# Patient Record
Sex: Male | Born: 1960 | Race: White | Hispanic: No | Marital: Married | Smoking: Current every day smoker
Health system: Southern US, Community
[De-identification: ages and names within clinical notes are randomized; demographics above are authoritative.]

## PROBLEM LIST (undated history)

## (undated) DIAGNOSIS — I10 Essential (primary) hypertension: Secondary | ICD-10-CM

## (undated) DIAGNOSIS — R809 Proteinuria, unspecified: Secondary | ICD-10-CM

## (undated) DIAGNOSIS — M79609 Pain in unspecified limb: Secondary | ICD-10-CM

## (undated) DIAGNOSIS — N189 Chronic kidney disease, unspecified: Secondary | ICD-10-CM

## (undated) DIAGNOSIS — I771 Stricture of artery: Secondary | ICD-10-CM

## (undated) DIAGNOSIS — K859 Acute pancreatitis without necrosis or infection, unspecified: Secondary | ICD-10-CM

## (undated) DIAGNOSIS — N400 Enlarged prostate without lower urinary tract symptoms: Secondary | ICD-10-CM

## (undated) HISTORY — DX: Essential (primary) hypertension: I10

## (undated) HISTORY — DX: Stricture of artery: I77.1

## (undated) HISTORY — DX: Chronic kidney disease, unspecified: N18.9

## (undated) HISTORY — DX: Pain in unspecified limb: M79.609

## (undated) HISTORY — DX: Acute pancreatitis without necrosis or infection, unspecified: K85.90

## (undated) HISTORY — DX: Proteinuria, unspecified: R80.9

## (undated) HISTORY — DX: Benign prostatic hyperplasia without lower urinary tract symptoms: N40.0

---

## 1988-03-15 HISTORY — PX: HERNIA REPAIR: SHX51

## 2008-08-07 ENCOUNTER — Ambulatory Visit (HOSPITAL_COMMUNITY): Admission: RE | Admit: 2008-08-07 | Discharge: 2008-08-07 | Payer: Self-pay | Admitting: Nephrology

## 2008-08-07 ENCOUNTER — Encounter (INDEPENDENT_AMBULATORY_CARE_PROVIDER_SITE_OTHER): Payer: Self-pay | Admitting: Interventional Radiology

## 2008-09-03 ENCOUNTER — Encounter: Admission: RE | Admit: 2008-09-03 | Discharge: 2008-09-03 | Payer: Self-pay | Admitting: Nephrology

## 2010-06-23 LAB — PROTIME-INR: INR: 1 (ref 0.00–1.49)

## 2010-06-23 LAB — CBC
Hemoglobin: 15.2 g/dL (ref 13.0–17.0)
RBC: 4.68 MIL/uL (ref 4.22–5.81)
RDW: 12.9 % (ref 11.5–15.5)

## 2010-06-23 LAB — APTT: aPTT: 47 seconds — ABNORMAL HIGH (ref 24–37)

## 2010-08-05 IMAGING — US US BIOPSY
1 series · 13 of 15 positions shown · non-contrast
Comparison: none

CLINICAL DATA: Proteinuria.  The patient requires random renal
biopsy.

[Series 1: us biopsy · 0.30mm/px · 13 of 15 slices shown]
[im 1/15]
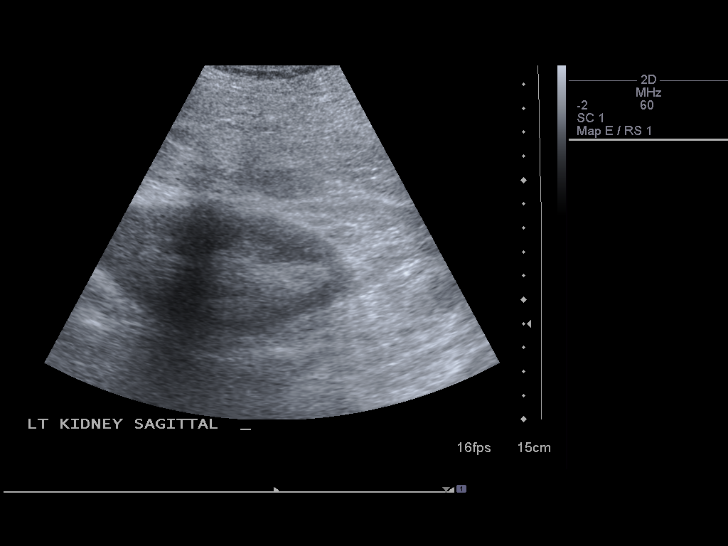
[im 2/15]
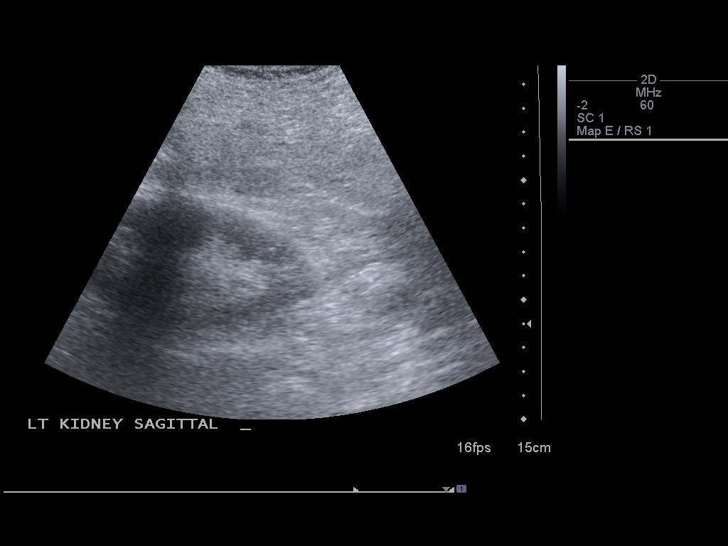
[im 3/15]
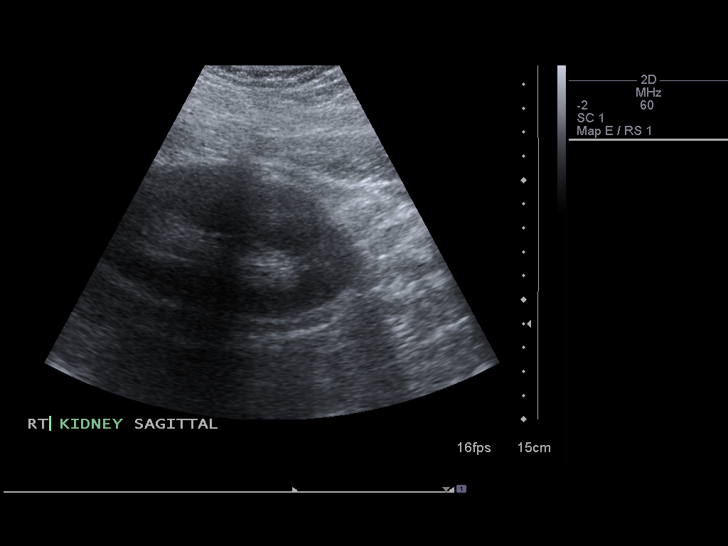
[im 5/15]
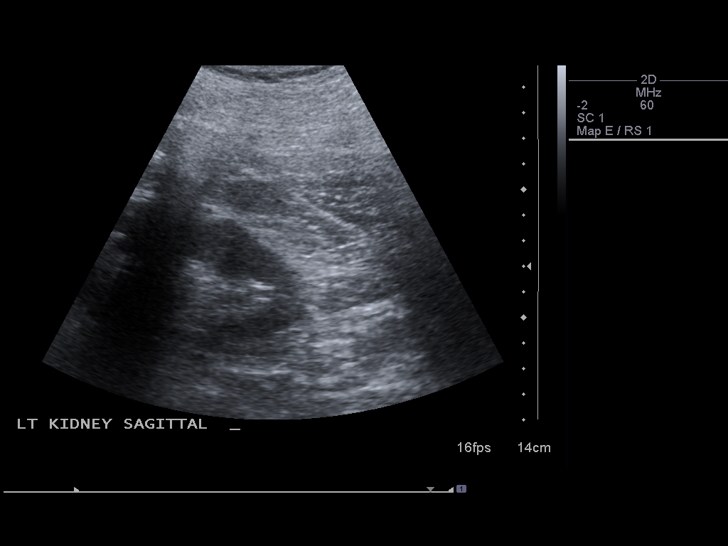
[im 6/15]
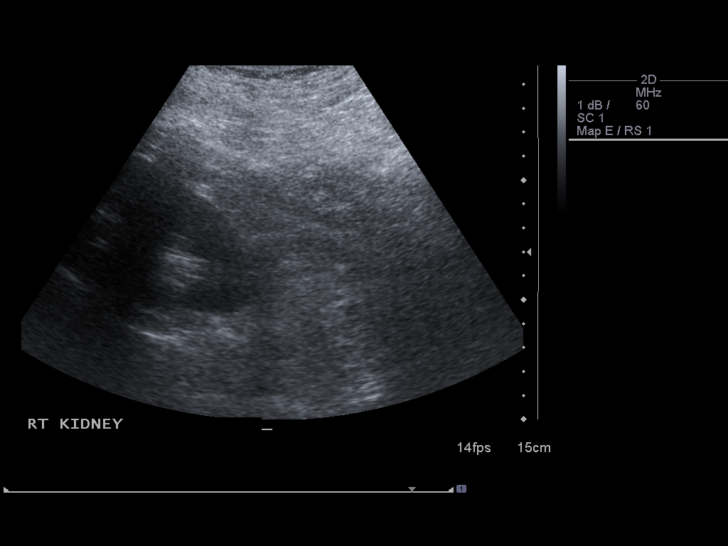
[im 7/15]
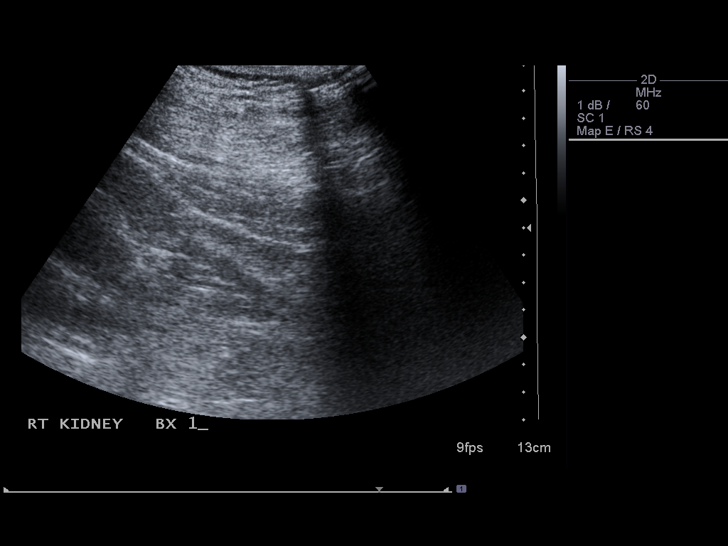
[im 8/15]
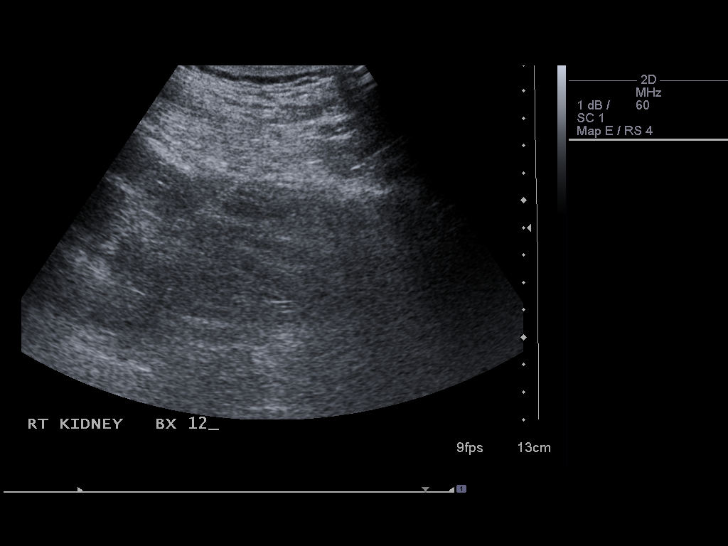
[im 9/15]
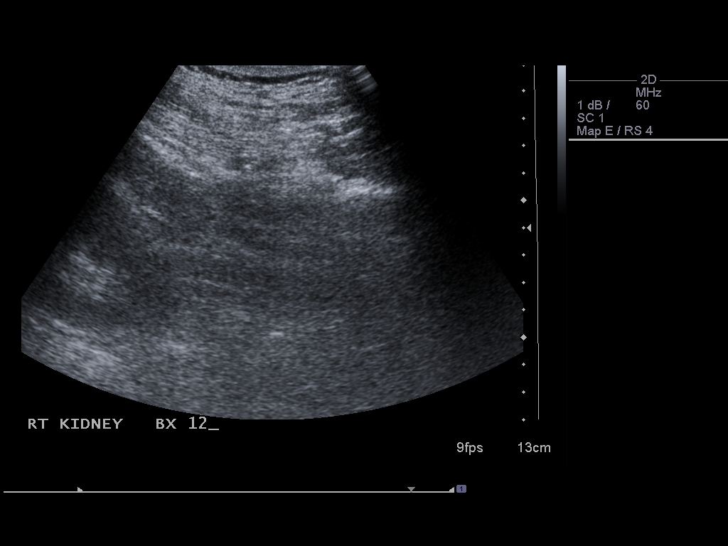
[im 10/15]
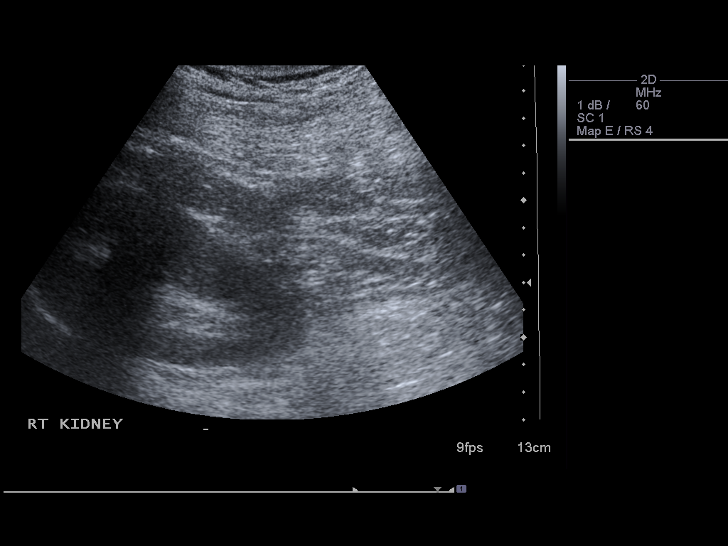
[im 11/15]
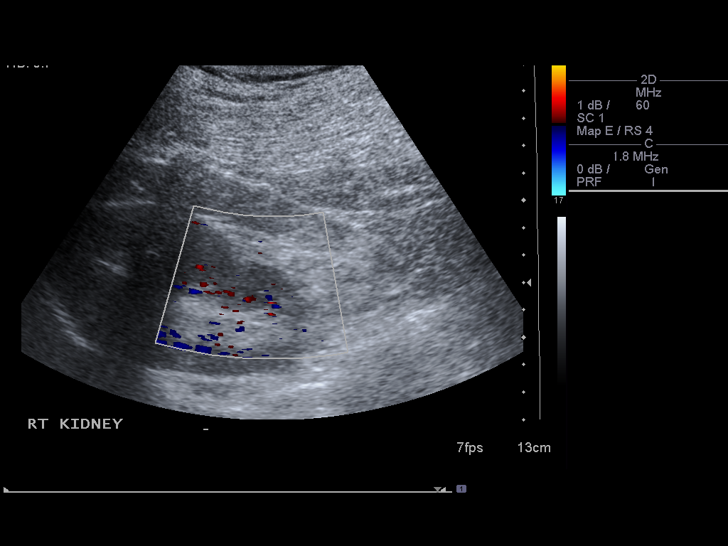
[im 13/15]
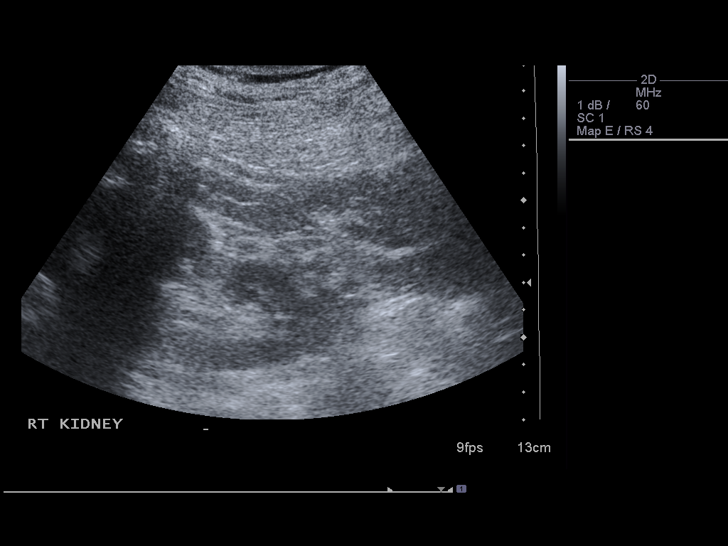
[im 14/15]
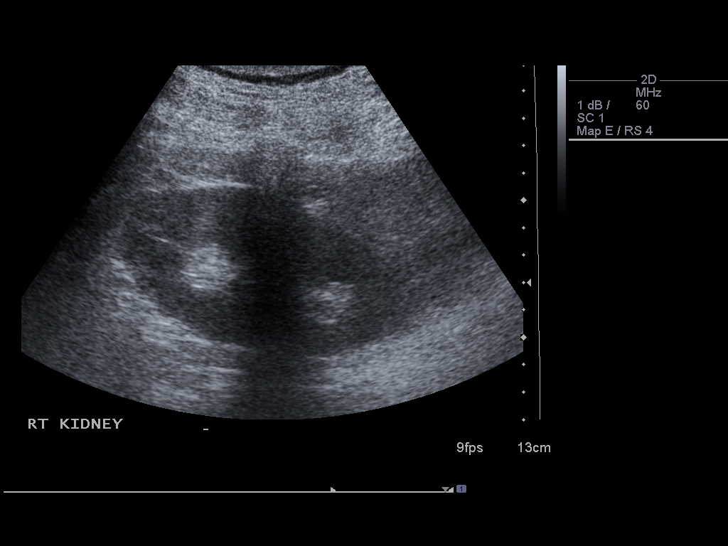
[im 15/15]
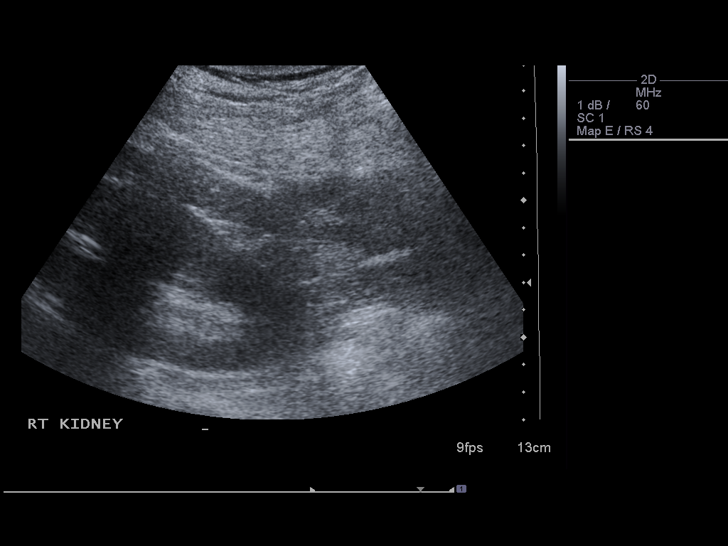

[13 of 15 positions shown; findings below may reference images not displayed]

ULTRASOUND GUIDED CORE BIOPSY OF THE RIGHT KIDNEY

Sedation: Versed 1.5 mg IV, Fentanyl 75 mcg IV

Total Moderate Sedation Time: 20 minutes.

Procedure:  The procedure, risks, benefits, and alternatives were
explained to the patient.  Questions regarding the procedure were
encouraged and answered.  The patient understands and consents to
the procedure.

The right flank region was prepped with betadine in a sterile
fashion, and a sterile drape was applied covering the operative
field.  A sterile gown and sterile gloves were used for the
procedure. Local anesthesia was provided with 1% Lidocaine.

Under ultrasound guidance, a 16 gauge core biopsy device was
advanced to the lower pole of the right kidney.  Three passes were
made with the core biopsy device.  Samples were placed in saline
and submitted for pathologic analysis.

Complications: None
FINDINGS: The right kidney was chosen due to better visibility and
thickness of the lower pole cortex.  The first pass resulted in an
intact solid core.  The second core biopsy pass was fragmented.
The third core biopsy pass resulted in an intact core.
IMPRESSION: Ultrasound guided core biopsy of the right kidney.  Three separate
passes were made of the lower pole with a 16 gauge core biopsy
device.

## 2012-03-21 ENCOUNTER — Encounter: Payer: Self-pay | Admitting: Vascular Surgery

## 2012-03-22 ENCOUNTER — Ambulatory Visit (INDEPENDENT_AMBULATORY_CARE_PROVIDER_SITE_OTHER): Payer: Self-pay | Admitting: Vascular Surgery

## 2012-03-22 ENCOUNTER — Encounter: Payer: Self-pay | Admitting: Vascular Surgery

## 2012-03-22 VITALS — BP 125/89 | HR 95 | Ht 70.0 in | Wt 269.6 lb

## 2012-03-22 DIAGNOSIS — I7090 Unspecified atherosclerosis: Secondary | ICD-10-CM

## 2012-03-22 DIAGNOSIS — I7409 Other arterial embolism and thrombosis of abdominal aorta: Secondary | ICD-10-CM

## 2012-03-22 DIAGNOSIS — I749 Embolism and thrombosis of unspecified artery: Secondary | ICD-10-CM
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
## 2012-03-22 NOTE — Assessment & Plan Note (Signed)
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 He has significant claudication and early rest pain. He is not a candidate for endovascular approach to his disease. For this reason I've explained that there are 2 options. I would favor conservative treatment which would include complete cessation of tobacco and getting on a structured walking program. We have had a long discussion about the importance of both of these. The second option would be surgical intervention. The options would be aortofemoral bypass grafting versus right axillofemoral bypass grafting. He would be at significant risk for aortofemoral bypass grafting given his obesity and the juxtarenal occlusion. We have discussed the potential risks of this surgery including the risk of wound healing problems related to his obesity and aortic graft infection. He does have some pressure difference in his arms and if he required axillofemoral bypass grafting it looks like he would require a right axillobifemoral bypass graft. He is in agreement with attempting conservative treatment. I will see him back in 3 months with ABIs. He knows to call sooner if he has problems.

## 2012-03-31 ENCOUNTER — Encounter: Payer: Self-pay | Admitting: Vascular Surgery

## 2012-06-20 ENCOUNTER — Encounter: Payer: Self-pay | Admitting: Vascular Surgery

## 2012-06-21 ENCOUNTER — Encounter: Payer: Self-pay | Admitting: Vascular Surgery

## 2012-06-21 ENCOUNTER — Ambulatory Visit (INDEPENDENT_AMBULATORY_CARE_PROVIDER_SITE_OTHER): Payer: No Typology Code available for payment source | Admitting: Vascular Surgery

## 2012-06-21 ENCOUNTER — Ambulatory Visit: Payer: Self-pay | Admitting: Vascular Surgery

## 2012-06-21 ENCOUNTER — Encounter (INDEPENDENT_AMBULATORY_CARE_PROVIDER_SITE_OTHER): Payer: No Typology Code available for payment source | Admitting: *Deleted

## 2012-06-21 VITALS — BP 121/84 | HR 90 | Ht 70.0 in | Wt 274.0 lb

## 2012-06-21 DIAGNOSIS — I749 Embolism and thrombosis of unspecified artery: Secondary | ICD-10-CM

## 2012-06-21 DIAGNOSIS — I7409 Other arterial embolism and thrombosis of abdominal aorta: Secondary | ICD-10-CM

## 2012-06-21 DIAGNOSIS — I700 Atherosclerosis of aorta: Secondary | ICD-10-CM

## 2012-06-21 DIAGNOSIS — I7090 Unspecified atherosclerosis: Secondary | ICD-10-CM

## 2012-06-21 NOTE — Progress Notes (Addendum)
[facility] [place]  Established Intermittent Claudication  History of Present Illness  Keith Townsend is a 52 y.o. (Aug 06, 1960) male who presents with chief complaint: foot and ankle pain with prolonged standing and walking.  The patient's symptoms have not progressed.  The patient's symptoms are: claudication.   The patient's treatment regimen currently included: maximal medical management and smoking cessation, walking program with improved diet.  He was smoking 3 packs a day and is now down to 1.5 per day.  Past Medical History, Past Surgical History, Social History, Family History, Medications, Allergies, and Review of Systems are unchanged from previous evaluation on 03-22-2012.  Physical Examination  Filed Vitals:   06/21/12 1336  BP: 121/84  Pulse: 90  Height: 5\' 10"  (1.778 m)  Weight: 274 lb (124.286 kg)  SpO2: 97%   Body mass index is 39.32 kg/(m^2).  General: A&O x 3, WDWN obese  Pulmonary: Sym exp, good air movt,+ wheezing  Cardiac: RRR, Nl S1, S2, no Murmurs, rubs or gallops  Vascular: Skin is warm to touch without ischemic changes bilateral lower extremities.   Vessel Right Left  Radial Palpable Palpable              Aorta Not palpable N/A  Femoral Not Palpable Not Palpable  Popliteal Not palpable Not palpable  PT not Palpable not Palpable  DP not Palpable not Palpable   Musculoskeletal: M/S 5/5 throughout   Neurologic: Pain and light touch intact in extremities   Non-Invasive Vascular Imaging ABI (Date: 06-21-2012)  RLE: 0.49  LLE: 0.58   Medical Decision Making  Keith Townsend is a 52 y.o. male who presents with: bilateral leg intermittent claudication without evidence of critical limb ischemia.  Based on the patient's vascular studies and examination, I have offered the patient: medical management.  Stop smoking walking program for exercise and healthy diet plan.  He will continue his current medications to  include Asprin daily.  Dr. Edilia Bo discussed in depth with the patient the nature of atherosclerosis, and emphasized the importance of maximal medical management including strict control of blood pressure, blood glucose, and lipid levels, antiplatelet agents, obtaining regular exercise, and cessation of smoking.  The patient is aware that without maximal medical management the underlying atherosclerotic disease process will progress, limiting the benefit of any interventions.  His surgery option is AAA to femoral by-pass which we will try to avoid if possible.  Thank you for allowing [place] to participate in this patient's care.  He will f/u in 6 months for ABI'S.  [name] [facility] PA-C [facility] [place] Field Office: [phone]   06/21/2012, 2:14 PM  Agree with above. We have again had a long discussion about conservative treatment versus aortofemoral bypass grafting. Given his obesity he would be at significantly increased risk for aortofemoral bypass grafting. I have discussed with the patient the nature of atherosclerosis, and emphasized the importance of maximal medical management including control of blood pressure, blood glucose, and cholesterol levels, antiplatelet agents, obtaining regular exercise, and cessation of smoking. The patient is aware that without maximal medical management the underlying atherosclerotic disease process will progress and also limit the benefit of any interventions. I'll see him back in 6 months to follow up ABIs. He knows to call sooner if he has problems.  Waverly Ferrari, MD, FACS Beeper [phone] 06/21/2012

## 2012-06-22 NOTE — Addendum Note (Signed)
Addended by: Adria Dill L on: 06/22/2012 09:42 AM   Modules accepted: Orders

## 2012-12-27 ENCOUNTER — Ambulatory Visit: Payer: No Typology Code available for payment source | Admitting: Family

## 2012-12-27 ENCOUNTER — Inpatient Hospital Stay (HOSPITAL_COMMUNITY): Admission: RE | Admit: 2012-12-27 | Payer: No Typology Code available for payment source | Source: Ambulatory Visit

## 2013-01-02 ENCOUNTER — Encounter: Payer: Self-pay | Admitting: Family

## 2013-01-03 ENCOUNTER — Ambulatory Visit (INDEPENDENT_AMBULATORY_CARE_PROVIDER_SITE_OTHER): Payer: No Typology Code available for payment source | Admitting: Family

## 2013-01-03 ENCOUNTER — Encounter: Payer: Self-pay | Admitting: Family

## 2013-01-03 ENCOUNTER — Ambulatory Visit (HOSPITAL_COMMUNITY)
Admission: RE | Admit: 2013-01-03 | Discharge: 2013-01-03 | Disposition: A | Discharge status: Home / Self Care | Payer: No Typology Code available for payment source | Source: Ambulatory Visit | Attending: Vascular Surgery | Admitting: Vascular Surgery

## 2013-01-03 VITALS — BP 124/81 | HR 81 | Resp 16 | Ht 70.0 in | Wt 291.0 lb

## 2013-01-03 DIAGNOSIS — I7090 Unspecified atherosclerosis: Secondary | ICD-10-CM

## 2013-01-03 DIAGNOSIS — I749 Embolism and thrombosis of unspecified artery: Secondary | ICD-10-CM

## 2013-01-03 DIAGNOSIS — I7409 Other arterial embolism and thrombosis of abdominal aorta: Secondary | ICD-10-CM

## 2013-01-03 DIAGNOSIS — I739 Peripheral vascular disease, unspecified: Secondary | ICD-10-CM
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
## 2013-01-03 NOTE — Patient Instructions (Signed)
Peripheral Vascular Disease Peripheral Vascular Disease (PVD), also called Peripheral Arterial Disease (PAD), is a circulation problem caused by cholesterol (atherosclerotic plaque) deposits in the arteries. PVD commonly occurs in the lower extremities (legs) but it can occur in other areas of the body, such as your arms. The cholesterol buildup in the arteries reduces blood flow which can cause pain and other serious problems. The presence of PVD can place a person at risk for Coronary Artery Disease (CAD).  CAUSES  Causes of PVD can be many. It is usually associated with more than one risk factor such as:   High Cholesterol.  Smoking.  Diabetes.  Lack of exercise or inactivity.  High blood pressure (hypertension).  Obesity.  Family history. SYMPTOMS   When the lower extremities are affected, patients with PVD may experience:  Leg pain with exertion or physical activity. This is called INTERMITTENT CLAUDICATION. This may present as cramping or numbness with physical activity. The location of the pain is associated with the level of blockage. For example, blockage at the abdominal level (distal abdominal aorta) may result in buttock or hip pain. Lower leg arterial blockage may result in calf pain.  As PVD becomes more severe, pain can develop with less physical activity.  In people with severe PVD, leg pain may occur at rest.  Other PVD signs and symptoms:  Leg numbness or weakness.  Coldness in the affected leg or foot, especially when compared to the other leg.  A change in leg color.  Patients with significant PVD are more prone to ulcers or sores on toes, feet or legs. These may take longer to heal or may reoccur. The ulcers or sores can become infected.  If signs and symptoms of PVD are ignored, gangrene may occur. This can result in the loss of toes or loss of an entire limb.  Not all leg pain is related to PVD. Other medical conditions can cause leg pain such  as:  Blood clots (embolism) or Deep Vein Thrombosis.  Inflammation of the blood vessels (vasculitis).  Spinal stenosis. DIAGNOSIS  Diagnosis of PVD can involve several different types of tests. These can include:  Pulse Volume Recording Method (PVR). This test is simple, painless and does not involve the use of X-rays. PVR involves measuring and comparing the blood pressure in the arms and legs. An ABI (Ankle-Brachial Index) is calculated. The normal ratio of blood pressures is 1. As this number becomes smaller, it indicates more severe disease.  < 0.95  indicates significant narrowing in one or more leg vessels.  <0.8 there will usually be pain in the foot, leg or buttock with exercise.  <0.4 will usually have pain in the legs at rest.  <0.25  usually indicates limb threatening PVD.  Doppler detection of pulses in the legs. This test is painless and checks to see if you have a pulses in your legs/feet.  A dye or contrast material (a substance that highlights the blood vessels so they show up on x-ray) may be given to help your caregiver better see the arteries for the following tests. The dye is eliminated from your body by the kidney's. Your caregiver may order blood work to check your kidney function and other laboratory values before the following tests are performed:  Magnetic Resonance Angiography (MRA). An MRA is a picture study of the blood vessels and arteries. The MRA machine uses a large magnet to produce images of the blood vessels.  Computed Tomography Angiography (CTA). A CTA is a   specialized x-ray that looks at how the blood flows in your blood vessels. An IV may be inserted into your arm so contrast dye can be injected.  Angiogram. Is a procedure that uses x-rays to look at your blood vessels. This procedure is minimally invasive, meaning a small incision (cut) is made in your groin. A small tube (catheter) is then inserted into the artery of your groin. The catheter is  guided to the blood vessel or artery your caregiver wants to examine. Contrast dye is injected into the catheter. X-rays are then taken of the blood vessel or artery. After the images are obtained, the catheter is taken out. TREATMENT  Treatment of PVD involves many interventions which may include:  Lifestyle changes:  Quitting smoking.  Exercise.  Following a low fat, low cholesterol diet.  Control of diabetes.  Foot care is very important to the PVD patient. Good foot care can help prevent infection.  Medication:  Cholesterol-lowering medicine.  Blood pressure medicine.  Anti-platelet drugs.  Certain medicines may reduce symptoms of Intermittent Claudication.  Interventional/Surgical options:  Angioplasty. An Angioplasty is a procedure that inflates a balloon in the blocked artery. This opens the blocked artery to improve blood flow.  Stent Implant. A wire mesh tube (stent) is placed in the artery. The stent expands and stays in place, allowing the artery to remain open.  Peripheral Bypass Surgery. This is a surgical procedure that reroutes the blood around a blocked artery to help improve blood flow. This type of procedure may be performed if Angioplasty or stent implants are not an option. SEEK IMMEDIATE MEDICAL CARE IF:   You develop pain or numbness in your arms or legs.  Your arm or leg turns cold, becomes blue in color.  You develop redness, warmth, swelling and pain in your arms or legs. MAKE SURE YOU:   Understand these instructions.  Will watch your condition.  Will get help right away if you are not doing well or get worse. Document Released: 04/08/2004 Document Revised: 05/24/2011 Document Reviewed: 03/05/2008 Exit[employer] Patient Information 2014 [employer].   Smoking Cessation Quitting smoking is important to your health and has many advantages. However, it is not always easy to quit since nicotine is a very addictive drug. Often times, people try 3  times or more before being able to quit. This document explains the best ways for you to prepare to quit smoking. Quitting takes hard work and a lot of effort, but you can do it. ADVANTAGES OF QUITTING SMOKING  You will live longer, feel better, and live better.  Your body will feel the impact of quitting smoking almost immediately.  Within 20 minutes, blood pressure decreases. Your pulse returns to its normal level.  After 8 hours, carbon monoxide levels in the blood return to normal. Your oxygen level increases.  After 24 hours, the chance of having a heart attack starts to decrease. Your breath, hair, and body stop smelling like smoke.  After 48 hours, damaged nerve endings begin to recover. Your sense of taste and smell improve.  After 72 hours, the body is virtually free of nicotine. Your bronchial tubes relax and breathing becomes easier.  After 2 to 12 weeks, lungs can hold more air. Exercise becomes easier and circulation improves.  The risk of having a heart attack, stroke, cancer, or lung disease is greatly reduced.  After 1 year, the risk of coronary heart disease is cut in half.  After 5 years, the risk of stroke falls to   the same as a nonsmoker.  After 10 years, the risk of lung cancer is cut in half and the risk of other cancers decreases significantly.  After 15 years, the risk of coronary heart disease drops, usually to the level of a nonsmoker.  If you are pregnant, quitting smoking will improve your chances of having a healthy baby.  The people you live with, especially any children, will be healthier.  You will have extra money to spend on things other than cigarettes. QUESTIONS TO THINK ABOUT BEFORE ATTEMPTING TO QUIT You may want to talk about your answers with your caregiver.  Why do you want to quit?  If you tried to quit in the past, what helped and what did not?  What will be the most difficult situations for you after you quit? How will you plan to  handle them?  Who can help you through the tough times? Your family? Friends? A caregiver?  What pleasures do you get from smoking? What ways can you still get pleasure if you quit? Here are some questions to ask your caregiver:  How can you help me to be successful at quitting?  What medicine do you think would be best for me and how should I take it?  What should I do if I need more help?  What is smoking withdrawal like? How can I get information on withdrawal? GET READY  Set a quit date.  Change your environment by getting rid of all cigarettes, ashtrays, matches, and lighters in your home, car, or work. Do not let people smoke in your home.  Review your past attempts to quit. Think about what worked and what did not. GET SUPPORT AND ENCOURAGEMENT You have a better chance of being successful if you have help. You can get support in many ways.  Tell your family, friends, and co-workers that you are going to quit and need their support. Ask them not to smoke around you.  Get individual, group, or telephone counseling and support. Programs are available at local hospitals and health centers. Call your local health department for information about programs in your area.  Spiritual beliefs and practices may help some smokers quit.  Download a "quit meter" on your computer to keep track of quit statistics, such as how long you have gone without smoking, cigarettes not smoked, and money saved.  Get a self-help book about quitting smoking and staying off of tobacco. LEARN NEW SKILLS AND BEHAVIORS  Distract yourself from urges to smoke. Talk to someone, go for a walk, or occupy your time with a task.  Change your normal routine. Take a different route to work. Drink tea instead of coffee. Eat breakfast in a different place.  Reduce your stress. Take a hot bath, exercise, or read a book.  Plan something enjoyable to do every day. Reward yourself for not smoking.  Explore  interactive web-based programs that specialize in helping you quit. GET MEDICINE AND USE IT CORRECTLY Medicines can help you stop smoking and decrease the urge to smoke. Combining medicine with the above behavioral methods and support can greatly increase your chances of successfully quitting smoking.  Nicotine replacement therapy helps deliver nicotine to your body without the negative effects and risks of smoking. Nicotine replacement therapy includes nicotine gum, lozenges, inhalers, nasal sprays, and skin patches. Some may be available over-the-counter and others require a prescription.  Antidepressant medicine helps people abstain from smoking, but how this works is unknown. This medicine is available by prescription.    Nicotinic receptor partial agonist medicine simulates the effect of nicotine in your brain. This medicine is available by prescription. Ask your caregiver for advice about which medicines to use and how to use them based on your health history. Your caregiver will tell you what side effects to look out for if you choose to be on a medicine or therapy. Carefully read the information on the package. Do not use any other product containing nicotine while using a nicotine replacement product.  RELAPSE OR DIFFICULT SITUATIONS Most relapses occur within the first 3 months after quitting. Do not be discouraged if you start smoking again. Remember, most people try several times before finally quitting. You may have symptoms of withdrawal because your body is used to nicotine. You may crave cigarettes, be irritable, feel very hungry, cough often, get headaches, or have difficulty concentrating. The withdrawal symptoms are only temporary. They are strongest when you first quit, but they will go away within 10 14 days. To reduce the chances of relapse, try to:  Avoid drinking alcohol. Drinking lowers your chances of successfully quitting.  Reduce the amount of caffeine you consume. Once you  quit smoking, the amount of caffeine in your body increases and can give you symptoms, such as a rapid heartbeat, sweating, and anxiety.  Avoid smokers because they can make you want to smoke.  Do not let weight gain distract you. Many smokers will gain weight when they quit, usually less than 10 pounds. Eat a healthy diet and stay active. You can always lose the weight gained after you quit.  Find ways to improve your mood other than smoking. FOR MORE INFORMATION  www.smokefree.gov  Document Released: 02/23/2001 Document Revised: 08/31/2011 Document Reviewed: 06/10/2011 [employer] Patient Information 2014 [employer].  

## 2013-01-04 NOTE — Addendum Note (Signed)
Addended by: Lorin Mercy K on: 01/04/2013 09:00 AM   Modules accepted: Orders

## 2013-07-10 ENCOUNTER — Encounter: Payer: Self-pay | Admitting: Family

## 2013-07-11 ENCOUNTER — Ambulatory Visit (INDEPENDENT_AMBULATORY_CARE_PROVIDER_SITE_OTHER): Payer: 59 | Admitting: Family

## 2013-07-11 ENCOUNTER — Ambulatory Visit (HOSPITAL_COMMUNITY)
Admission: RE | Admit: 2013-07-11 | Discharge: 2013-07-11 | Disposition: A | Discharge status: Home / Self Care | Payer: 59 | Source: Ambulatory Visit | Attending: Family | Admitting: Family

## 2013-07-11 ENCOUNTER — Encounter: Payer: Self-pay | Admitting: Family

## 2013-07-11 VITALS — BP 141/82 | HR 80 | Resp 16 | Ht 71.0 in | Wt 294.0 lb

## 2013-07-11 DIAGNOSIS — I7090 Unspecified atherosclerosis: Secondary | ICD-10-CM

## 2013-07-11 DIAGNOSIS — I739 Peripheral vascular disease, unspecified: Secondary | ICD-10-CM | POA: Insufficient documentation

## 2013-07-11 DIAGNOSIS — I749 Embolism and thrombosis of unspecified artery: Secondary | ICD-10-CM

## 2013-07-11 NOTE — Progress Notes (Signed)
[facility] [place] HISTORY AND PHYSICAL -PAD  History of Present Illness Keith Townsend is a 53 y.o. male male patient that Dr. Edilia Boickson has been monitoring for PAD.  Dr. Edilia Boickson has had several discussions with the patient about conservative treatment versus aortofemoral bypass grafting. Given his obesity he would be at significantly increased risk for aortofemoral bypass grafting.  He was smoking 3 packs a day and decreased to 1.5 PPD.  He has lumbar back problems since a tractor accident years ago; had an MRI of his back which could not find back issues, but his chiropractor told him he had DDD.  Walks about 6 minutes on treadmill and starts to feel sharp pain in both hips that progresses to numbness in both legs, left worse than right, then legs feel weak. Has not been using treadmill due to back pain.  Since the weather has improved he is trying to walk outside.  Patient denies cardiac problems, denies history of stroke or TIA, denies steal symptoms in either arm.  Pt. reports rest pain , described as burning and aching in his feet, equal in intensity lying or sitting; denies non healing ulcers on extremities.  Patient denies New Medical or Surgical History.   Pt Diabetic: No  Pt smoker: smoker (1 1/2 ppd x 30 yrs)  Pt meds include:  Statin :Yes  ASA: Yes  Other anticoagulants/antiplatelets: no  Past Medical History  Diagnosis Date  . Hypertension   . Stenosis of iliac artery   . Pain in limb   . Chronic kidney disease   . Proteinuria   . Pancreatitis   . Enlarged prostate     Social History History  Substance Use Topics  . Smoking status: Current Every Day Smoker -- 1.00 packs/day    Types: Cigarettes  . Smokeless tobacco: Never Used     Comment: pt states that he thinks that he will be able to quit without assistance  . Alcohol Use: No    Family History Family History  Problem Relation Age of Onset  . Diabetes Mother   . Hypertension  Mother   . Heart disease Mother     Heart Disease before age 560  . Hyperlipidemia Mother   . Peripheral vascular disease Mother   . Other Mother     varicose veins  . Heart disease Father   . Hyperlipidemia Father   . Hypertension Father   . Heart attack Father   . Other Father     varicose veins  . Heart disease Sister     before age 160  . Hypertension Sister   . Heart attack Sister   . Other Sister     varicose veins  . Hyperlipidemia Sister   . Hypertension Brother   . Other Brother     varicose veins  . Hyperlipidemia Brother   . Heart attack Sister     Past Surgical History  Procedure Laterality Date  . Hernia repair  1990    No Known Allergies  Current Outpatient Prescriptions  Medication Sig Dispense Refill  . aspirin 81 MG tablet Take 81 mg by mouth daily.      . hydrochlorothiazide (MICROZIDE) 12.5 MG capsule Take 12.5 mg by mouth daily.      Marland Kitchen. HYDROcodone-acetaminophen (NORCO/VICODIN) 5-325 MG per tablet       . lisinopril (PRINIVIL,ZESTRIL) 10 MG tablet Take 10 mg by mouth daily.      . pravastatin (PRAVACHOL) 20 MG tablet daily.      .Marland Kitchen  albuterol (PROVENTIL HFA;VENTOLIN HFA) 108 (90 BASE) MCG/ACT inhaler Inhale 2 puffs into the lungs every 6 (six) hours as needed.      . gabapentin (NEURONTIN) 300 MG capsule Take 300 mg by mouth 3 (three) times daily.      . mometasone-formoterol (DULERA) 100-5 MCG/ACT AERO Inhale 2 puffs into the lungs.      Marland Kitchen. omeprazole (PRILOSEC) 20 MG capsule Take 20 mg by mouth daily.       No current facility-administered medications for this visit.    ROS: See HPI for pertinent positives and negatives.   Physical Examination  Filed Vitals:   07/11/13 1544  BP: 141/82  Pulse: 80  Resp: 16   Filed Weights   07/11/13 1544  Weight: 294 lb (133.358 kg)   Body mass index is 41.02 kg/(m^2).  General: A&O x 3, WDWN, morbidly obese male.  Gait: normal  Eyes: Pupils equal  Pulmonary: CTAB, distant breath sounds in all  fields, without wheezes , rales or rhonchi.  Cardiac: regular Rythm , without murmur.  Carotid Bruits  Left  Right    Negative  Negative   Aorta: not palpable.  Radial pulses: 3+ right, 1+ left.  VASCULAR EXAM:  Extremities without ischemic changes  without Gangrene; without open wounds. 1+ bilateral pretibial pitting edema, no venous stasis changes.  LE Pulses  LEFT  RIGHT   FEMORAL  not palpable  not palpable   POPLITEAL  not palpable  not palpable   POSTERIOR TIBIAL  not palpable  not palpable   DORSALIS PEDIS  ANTERIOR TIBIAL  not palpable  not palpable   Abdomen: soft, NT, no masses.  Skin: no rashes, no ulcers noted.  Musculoskeletal: no muscle wasting or atrophy.  Neurologic: A&O X 3; Appropriate Affect ; SENSATION: normal; MOTOR FUNCTION: moving all extremities equally, motor strength 5/5 throughout. Speech is fluent/normal. CN 2-12 intact.  Non-Invasive Vascular Imaging: DATE: 01/03/2013  ABI: RIGHT 0.61, previous (06/21/12) was 0.49, Waveforms: monophasic; LEFT 0.67, previous was 0.58, Waveforms: monophasic.   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  The celiac trunk and SMA appear patent.  [facility]ation[facility] the above CT angiogram.    Non-Invasive Vascular Imaging: DATE: 07/11/2013 ABI: RIGHT 0.59, Waveforms: monophasic;  LEFT 0.54, Waveforms: monophasic Previous (01/03/13) ABI's: Right: 0.61, Left: 0.67  ASSESSMENT: Keith Townsend is a 53 y.o. 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  He may have rest pain in his feet vs pain from know lumbar disc disease, he does not have non healing ulcers. His walking is severely limited by what seems like radiculopathy type pain. He also smokes 1.5 ppd and is morbidly  obese. ABI's today indicate that his arterial occlusive disease in his LE's has progressed from moderate to severe.    PLAN:  Pt was counseled re smoking cessation. I discussed in depth with the patient the nature of atherosclerosis, and emphasized the importance of maximal medical management including strict control of blood pressure, blood glucose, and lipid levels, obtaining regular exercise, and cessation of smoking.  The patient is aware that without maximal medical management the underlying atherosclerotic disease process will progress, limiting the benefit of any interventions.  Based on the patient's vascular studies and examination, and after discussing with Dr. Edilia Boickson, pt will return to clinic in 6 months for ABI's and  see Dr. Edilia Bo.  The patient was given information about PAD including signs, symptoms, treatment, what symptoms should prompt the patient to seek immediate medical care, and risk reduction measures to take.  Charisse March, RN, MSN, FNP-C [facility] Phone: [phone]  Clinic MD: Edilia Bo  07/11/2013 4:29 PM

## 2013-07-11 NOTE — Patient Instructions (Signed)
Peripheral Vascular Disease Peripheral Vascular Disease (PVD), also called Peripheral Arterial Disease (PAD), is a circulation problem caused by cholesterol (atherosclerotic plaque) deposits in the arteries. PVD commonly occurs in the lower extremities (legs) but it can occur in other areas of the body, such as your arms. The cholesterol buildup in the arteries reduces blood flow which can cause pain and other serious problems. The presence of PVD can place a person at risk for Coronary Artery Disease (CAD).  CAUSES  Causes of PVD can be many. It is usually associated with more than one risk factor such as:   High Cholesterol.  Smoking.  Diabetes.  Lack of exercise or inactivity.  High blood pressure (hypertension).  Obesity.  Family history. SYMPTOMS   When the lower extremities are affected, patients with PVD may experience:  Leg pain with exertion or physical activity. This is called INTERMITTENT CLAUDICATION. This may present as cramping or numbness with physical activity. The location of the pain is associated with the level of blockage. For example, blockage at the abdominal level (distal abdominal aorta) may result in buttock or hip pain. Lower leg arterial blockage may result in calf pain.  As PVD becomes more severe, pain can develop with less physical activity.  In people with severe PVD, leg pain may occur at rest.  Other PVD signs and symptoms:  Leg numbness or weakness.  Coldness in the affected leg or foot, especially when compared to the other leg.  A change in leg color.  Patients with significant PVD are more prone to ulcers or sores on toes, feet or legs. These may take longer to heal or may reoccur. The ulcers or sores can become infected.  If signs and symptoms of PVD are ignored, gangrene may occur. This can result in the loss of toes or loss of an entire limb.  Not all leg pain is related to PVD. Other medical conditions can cause leg pain such  as:  Blood clots (embolism) or Deep Vein Thrombosis.  Inflammation of the blood vessels (vasculitis).  Spinal stenosis. DIAGNOSIS  Diagnosis of PVD can involve several different types of tests. These can include:  Pulse Volume Recording Method (PVR). This test is simple, painless and does not involve the use of X-rays. PVR involves measuring and comparing the blood pressure in the arms and legs. An ABI (Ankle-Brachial Index) is calculated. The normal ratio of blood pressures is 1. As this number becomes smaller, it indicates more severe disease.  < 0.95  indicates significant narrowing in one or more leg vessels.  <0.8 there will usually be pain in the foot, leg or buttock with exercise.  <0.4 will usually have pain in the legs at rest.  <0.25  usually indicates limb threatening PVD.  Doppler detection of pulses in the legs. This test is painless and checks to see if you have a pulses in your legs/feet.  A dye or contrast material (a substance that highlights the blood vessels so they show up on x-ray) may be given to help your caregiver better see the arteries for the following tests. The dye is eliminated from your body by the kidney's. Your caregiver may order blood work to check your kidney function and other laboratory values before the following tests are performed:  Magnetic Resonance Angiography (MRA). An MRA is a picture study of the blood vessels and arteries. The MRA machine uses a large magnet to produce images of the blood vessels.  Computed Tomography Angiography (CTA). A CTA is a   specialized x-ray that looks at how the blood flows in your blood vessels. An IV may be inserted into your arm so contrast dye can be injected.  Angiogram. Is a procedure that uses x-rays to look at your blood vessels. This procedure is minimally invasive, meaning a small incision (cut) is made in your groin. A small tube (catheter) is then inserted into the artery of your groin. The catheter is  guided to the blood vessel or artery your caregiver wants to examine. Contrast dye is injected into the catheter. X-rays are then taken of the blood vessel or artery. After the images are obtained, the catheter is taken out. TREATMENT  Treatment of PVD involves many interventions which may include:  Lifestyle changes:  Quitting smoking.  Exercise.  Following a low fat, low cholesterol diet.  Control of diabetes.  Foot care is very important to the PVD patient. Good foot care can help prevent infection.  Medication:  Cholesterol-lowering medicine.  Blood pressure medicine.  Anti-platelet drugs.  Certain medicines may reduce symptoms of Intermittent Claudication.  Interventional/Surgical options:  Angioplasty. An Angioplasty is a procedure that inflates a balloon in the blocked artery. This opens the blocked artery to improve blood flow.  Stent Implant. A wire mesh tube (stent) is placed in the artery. The stent expands and stays in place, allowing the artery to remain open.  Peripheral Bypass Surgery. This is a surgical procedure that reroutes the blood around a blocked artery to help improve blood flow. This type of procedure may be performed if Angioplasty or stent implants are not an option. SEEK IMMEDIATE MEDICAL CARE IF:   You develop pain or numbness in your arms or legs.  Your arm or leg turns cold, becomes blue in color.  You develop redness, warmth, swelling and pain in your arms or legs. MAKE SURE YOU:   Understand these instructions.  Will watch your condition.  Will get help right away if you are not doing well or get worse. Document Released: 04/08/2004 Document Revised: 05/24/2011 Document Reviewed: 03/05/2008 Exit[employer] Patient Information 2014 [employer].   Smoking Cessation Quitting smoking is important to your health and has many advantages. However, it is not always easy to quit since nicotine is a very addictive drug. Often times, people try 3  times or more before being able to quit. This document explains the best ways for you to prepare to quit smoking. Quitting takes hard work and a lot of effort, but you can do it. ADVANTAGES OF QUITTING SMOKING  You will live longer, feel better, and live better.  Your body will feel the impact of quitting smoking almost immediately.  Within 20 minutes, blood pressure decreases. Your pulse returns to its normal level.  After 8 hours, carbon monoxide levels in the blood return to normal. Your oxygen level increases.  After 24 hours, the chance of having a heart attack starts to decrease. Your breath, hair, and body stop smelling like smoke.  After 48 hours, damaged nerve endings begin to recover. Your sense of taste and smell improve.  After 72 hours, the body is virtually free of nicotine. Your bronchial tubes relax and breathing becomes easier.  After 2 to 12 weeks, lungs can hold more air. Exercise becomes easier and circulation improves.  The risk of having a heart attack, stroke, cancer, or lung disease is greatly reduced.  After 1 year, the risk of coronary heart disease is cut in half.  After 5 years, the risk of stroke falls to   the same as a nonsmoker.  After 10 years, the risk of lung cancer is cut in half and the risk of other cancers decreases significantly.  After 15 years, the risk of coronary heart disease drops, usually to the level of a nonsmoker.  If you are pregnant, quitting smoking will improve your chances of having a healthy baby.  The people you live with, especially any children, will be healthier.  You will have extra money to spend on things other than cigarettes. QUESTIONS TO THINK ABOUT BEFORE ATTEMPTING TO QUIT You may want to talk about your answers with your caregiver.  Why do you want to quit?  If you tried to quit in the past, what helped and what did not?  What will be the most difficult situations for you after you quit? How will you plan to  handle them?  Who can help you through the tough times? Your family? Friends? A caregiver?  What pleasures do you get from smoking? What ways can you still get pleasure if you quit? Here are some questions to ask your caregiver:  How can you help me to be successful at quitting?  What medicine do you think would be best for me and how should I take it?  What should I do if I need more help?  What is smoking withdrawal like? How can I get information on withdrawal? GET READY  Set a quit date.  Change your environment by getting rid of all cigarettes, ashtrays, matches, and lighters in your home, car, or work. Do not let people smoke in your home.  Review your past attempts to quit. Think about what worked and what did not. GET SUPPORT AND ENCOURAGEMENT You have a better chance of being successful if you have help. You can get support in many ways.  Tell your family, friends, and co-workers that you are going to quit and need their support. Ask them not to smoke around you.  Get individual, group, or telephone counseling and support. Programs are available at local hospitals and health centers. Call your local health department for information about programs in your area.  Spiritual beliefs and practices may help some smokers quit.  Download a "quit meter" on your computer to keep track of quit statistics, such as how long you have gone without smoking, cigarettes not smoked, and money saved.  Get a self-help book about quitting smoking and staying off of tobacco. LEARN NEW SKILLS AND BEHAVIORS  Distract yourself from urges to smoke. Talk to someone, go for a walk, or occupy your time with a task.  Change your normal routine. Take a different route to work. Drink tea instead of coffee. Eat breakfast in a different place.  Reduce your stress. Take a hot bath, exercise, or read a book.  Plan something enjoyable to do every day. Reward yourself for not smoking.  Explore  interactive web-based programs that specialize in helping you quit. GET MEDICINE AND USE IT CORRECTLY Medicines can help you stop smoking and decrease the urge to smoke. Combining medicine with the above behavioral methods and support can greatly increase your chances of successfully quitting smoking.  Nicotine replacement therapy helps deliver nicotine to your body without the negative effects and risks of smoking. Nicotine replacement therapy includes nicotine gum, lozenges, inhalers, nasal sprays, and skin patches. Some may be available over-the-counter and others require a prescription.  Antidepressant medicine helps people abstain from smoking, but how this works is unknown. This medicine is available by prescription.    Nicotinic receptor partial agonist medicine simulates the effect of nicotine in your brain. This medicine is available by prescription. Ask your caregiver for advice about which medicines to use and how to use them based on your health history. Your caregiver will tell you what side effects to look out for if you choose to be on a medicine or therapy. Carefully read the information on the package. Do not use any other product containing nicotine while using a nicotine replacement product.  RELAPSE OR DIFFICULT SITUATIONS Most relapses occur within the first 3 months after quitting. Do not be discouraged if you start smoking again. Remember, most people try several times before finally quitting. You may have symptoms of withdrawal because your body is used to nicotine. You may crave cigarettes, be irritable, feel very hungry, cough often, get headaches, or have difficulty concentrating. The withdrawal symptoms are only temporary. They are strongest when you first quit, but they will go away within 10 14 days. To reduce the chances of relapse, try to:  Avoid drinking alcohol. Drinking lowers your chances of successfully quitting.  Reduce the amount of caffeine you consume. Once you  quit smoking, the amount of caffeine in your body increases and can give you symptoms, such as a rapid heartbeat, sweating, and anxiety.  Avoid smokers because they can make you want to smoke.  Do not let weight gain distract you. Many smokers will gain weight when they quit, usually less than 10 pounds. Eat a healthy diet and stay active. You can always lose the weight gained after you quit.  Find ways to improve your mood other than smoking. FOR MORE INFORMATION  www.smokefree.gov  Document Released: 02/23/2001 Document Revised: 08/31/2011 Document Reviewed: 06/10/2011 [employer] Patient Information 2014 [employer].  

## 2014-01-08 ENCOUNTER — Encounter: Payer: Self-pay | Admitting: Vascular Surgery

## 2014-01-09 ENCOUNTER — Ambulatory Visit: Payer: 59 | Admitting: Vascular Surgery

## 2014-01-09 ENCOUNTER — Encounter (HOSPITAL_COMMUNITY): Payer: 59

## 2014-07-17 ENCOUNTER — Encounter (HOSPITAL_COMMUNITY): Payer: 59

## 2014-07-17 ENCOUNTER — Ambulatory Visit: Payer: 59 | Admitting: Vascular Surgery
